# Patient Record
Sex: Female | Born: 1955 | Race: White | Hispanic: No | Smoking: Former smoker
Health system: Southern US, Community
[De-identification: ages and names within clinical notes are randomized; demographics above are authoritative.]

---

## 1998-03-13 ENCOUNTER — Other Ambulatory Visit: Admission: RE | Admit: 1998-03-13 | Discharge: 1998-03-13 | Payer: Self-pay | Admitting: Obstetrics and Gynecology

## 1999-03-21 ENCOUNTER — Other Ambulatory Visit: Admission: RE | Admit: 1999-03-21 | Discharge: 1999-03-21 | Payer: Self-pay | Admitting: Obstetrics and Gynecology

## 2000-04-16 ENCOUNTER — Other Ambulatory Visit: Admission: RE | Admit: 2000-04-16 | Discharge: 2000-04-16 | Payer: Self-pay | Admitting: Obstetrics and Gynecology

## 2001-07-29 ENCOUNTER — Other Ambulatory Visit: Admission: RE | Admit: 2001-07-29 | Discharge: 2001-07-29 | Payer: Self-pay | Admitting: Obstetrics and Gynecology

## 2002-11-08 ENCOUNTER — Other Ambulatory Visit: Admission: RE | Admit: 2002-11-08 | Discharge: 2002-11-08 | Payer: Self-pay | Admitting: Obstetrics and Gynecology

## 2003-12-26 ENCOUNTER — Other Ambulatory Visit: Admission: RE | Admit: 2003-12-26 | Discharge: 2003-12-26 | Payer: Self-pay | Admitting: Obstetrics and Gynecology

## 2005-01-21 ENCOUNTER — Other Ambulatory Visit: Admission: RE | Admit: 2005-01-21 | Discharge: 2005-01-21 | Payer: Self-pay | Admitting: Obstetrics and Gynecology

## 2009-01-31 ENCOUNTER — Encounter: Admission: RE | Admit: 2009-01-31 | Discharge: 2009-01-31 | Payer: Self-pay | Admitting: Family Medicine

## 2012-03-16 ENCOUNTER — Other Ambulatory Visit: Payer: Self-pay | Admitting: Family Medicine

## 2012-03-16 DIAGNOSIS — H539 Unspecified visual disturbance: Secondary | ICD-10-CM

## 2012-03-23 ENCOUNTER — Ambulatory Visit
Admission: RE | Admit: 2012-03-23 | Discharge: 2012-03-23 | Disposition: A | Discharge status: Home / Self Care | Payer: PRIVATE HEALTH INSURANCE | Source: Ambulatory Visit | Attending: Family Medicine | Admitting: Family Medicine

## 2012-03-23 DIAGNOSIS — H539 Unspecified visual disturbance: Secondary | ICD-10-CM

## 2012-09-21 ENCOUNTER — Other Ambulatory Visit: Payer: Self-pay | Admitting: Obstetrics and Gynecology

## 2012-09-21 DIAGNOSIS — R928 Other abnormal and inconclusive findings on diagnostic imaging of breast: Secondary | ICD-10-CM

## 2012-09-28 ENCOUNTER — Ambulatory Visit
Admission: RE | Admit: 2012-09-28 | Discharge: 2012-09-28 | Disposition: A | Discharge status: Home / Self Care | Payer: PRIVATE HEALTH INSURANCE | Source: Ambulatory Visit | Attending: Obstetrics and Gynecology | Admitting: Obstetrics and Gynecology

## 2012-09-28 DIAGNOSIS — R928 Other abnormal and inconclusive findings on diagnostic imaging of breast: Secondary | ICD-10-CM

## 2013-09-23 ENCOUNTER — Other Ambulatory Visit: Payer: Self-pay | Admitting: Obstetrics and Gynecology

## 2013-09-23 DIAGNOSIS — Z780 Asymptomatic menopausal state: Secondary | ICD-10-CM

## 2013-09-23 DIAGNOSIS — Z1231 Encounter for screening mammogram for malignant neoplasm of breast: Secondary | ICD-10-CM

## 2013-11-01 ENCOUNTER — Ambulatory Visit
Admission: RE | Admit: 2013-11-01 | Discharge: 2013-11-01 | Disposition: A | Discharge status: Home / Self Care | Payer: No Typology Code available for payment source | Source: Ambulatory Visit | Attending: Obstetrics and Gynecology | Admitting: Obstetrics and Gynecology

## 2013-11-01 DIAGNOSIS — Z1231 Encounter for screening mammogram for malignant neoplasm of breast: Secondary | ICD-10-CM

## 2013-11-01 DIAGNOSIS — Z780 Asymptomatic menopausal state: Secondary | ICD-10-CM

## 2014-05-08 IMAGING — MG MM DIGITAL SCREENING BILAT W/ CAD
9 of 12 series · 9 of 28 positions shown · non-contrast
Comparison: Previous exam(s).

CLINICAL DATA: Screening.

EXAM:
DIGITAL SCREENING BILATERAL MAMMOGRAM WITH CAD
DIGITAL BREAST TOMOSYNTHESIS
Digital breast tomosynthesis images are acquired in two projections.
These images are reviewed in combination with the digital mammogram,
confirming the findings below.

[L CC]
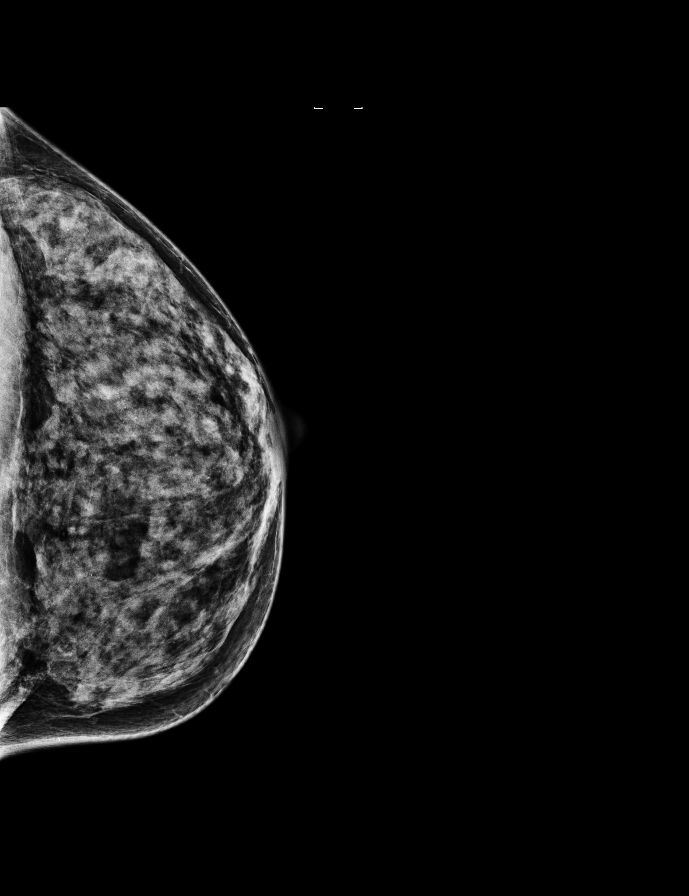

[R CC]
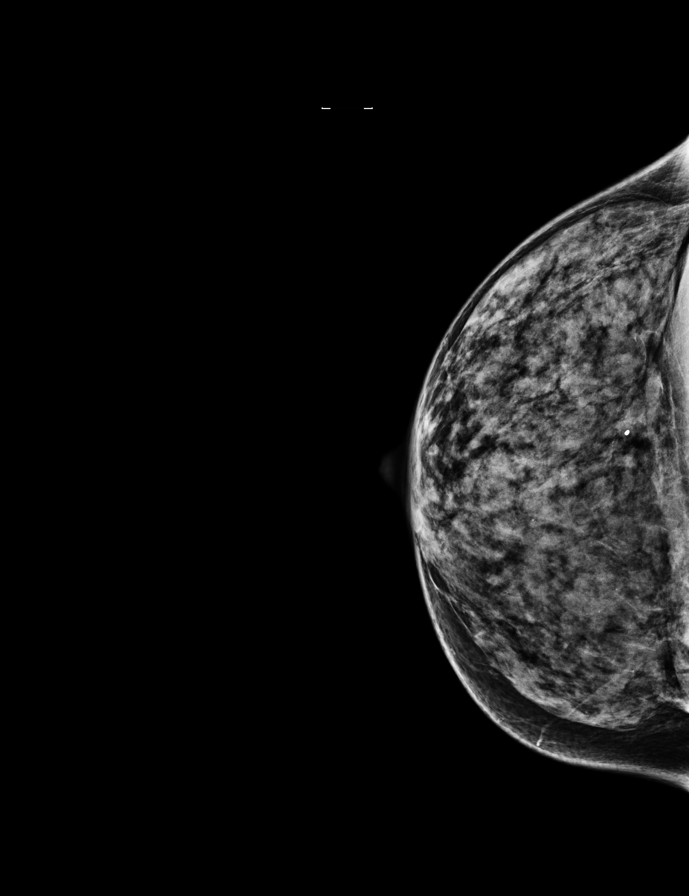

[R MLO]
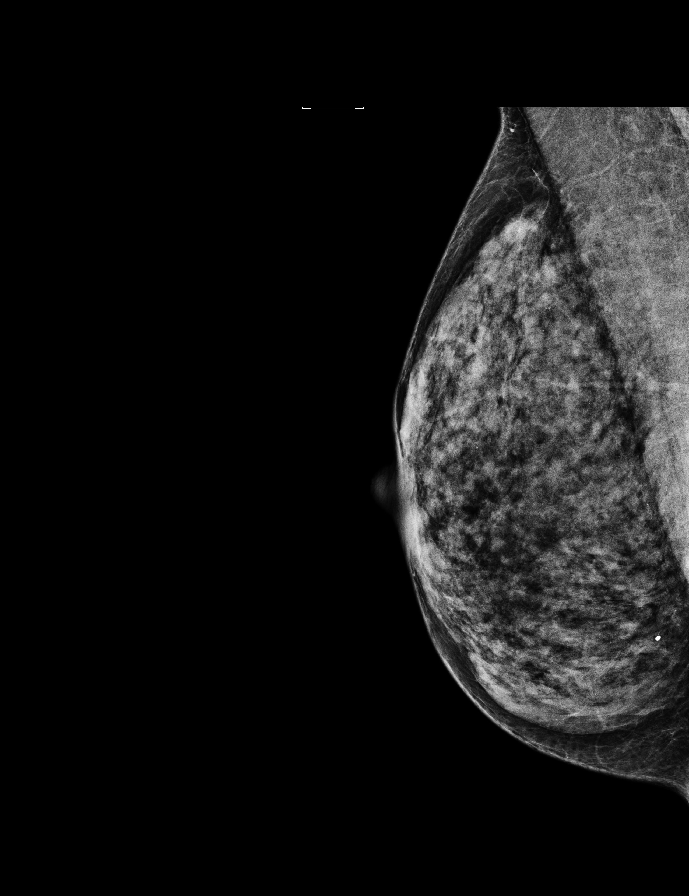

[L MLO]
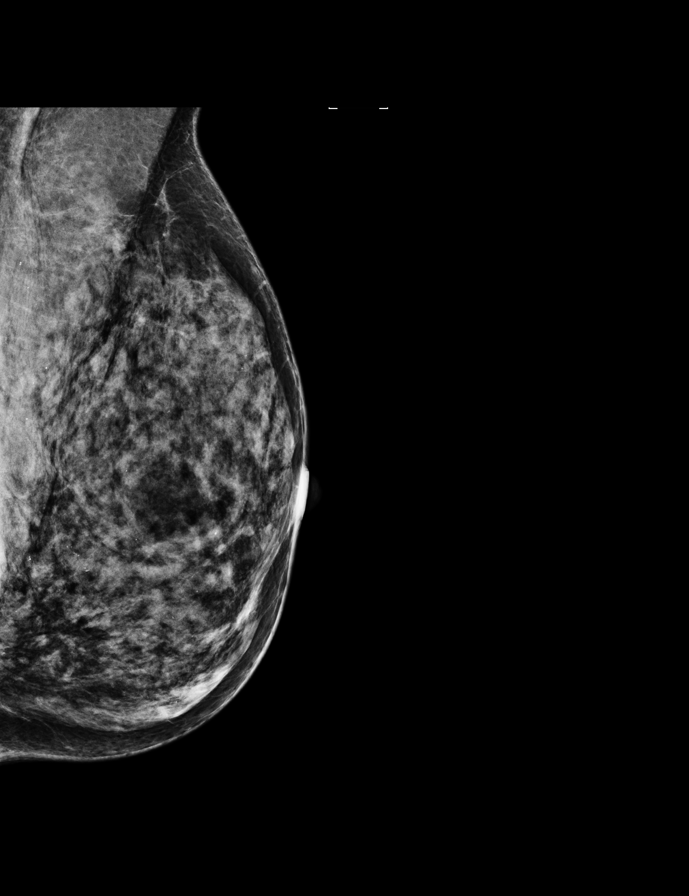

[R MLO tomo · tomo slice 22/43.0]
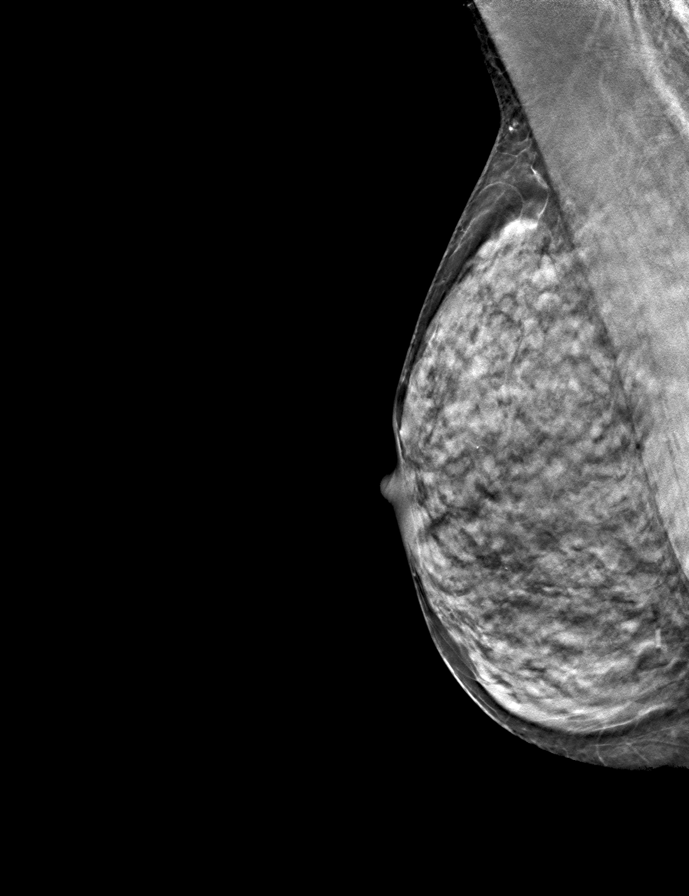

[L CC synth-2D]
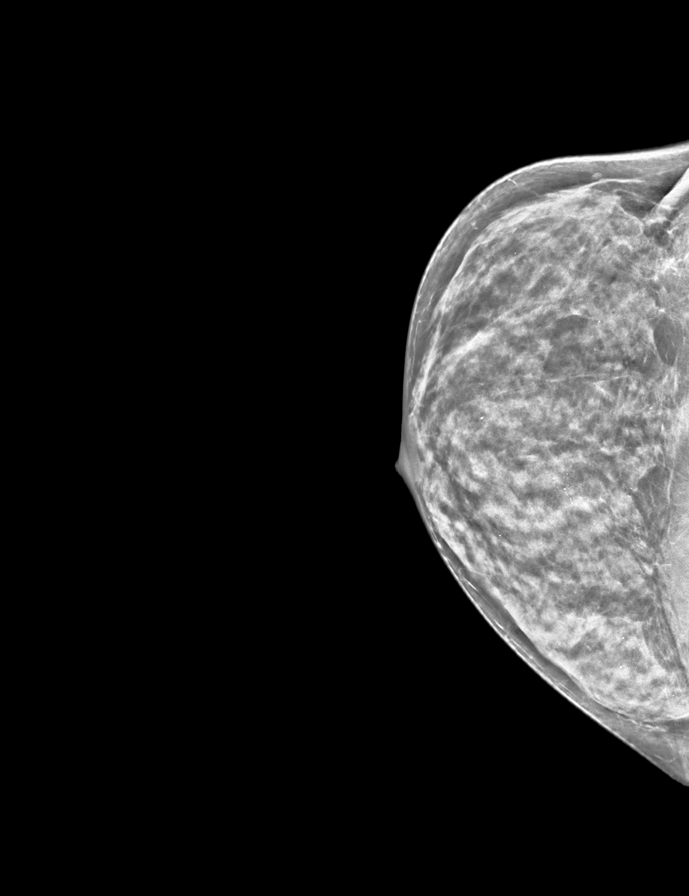

[R CC synth-2D]
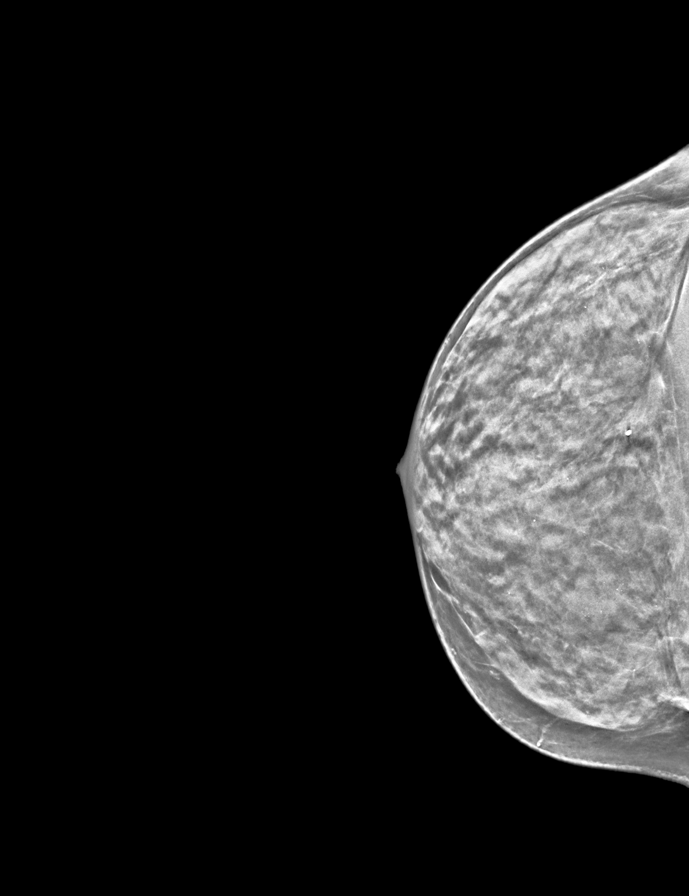

[L MLO synth-2D]
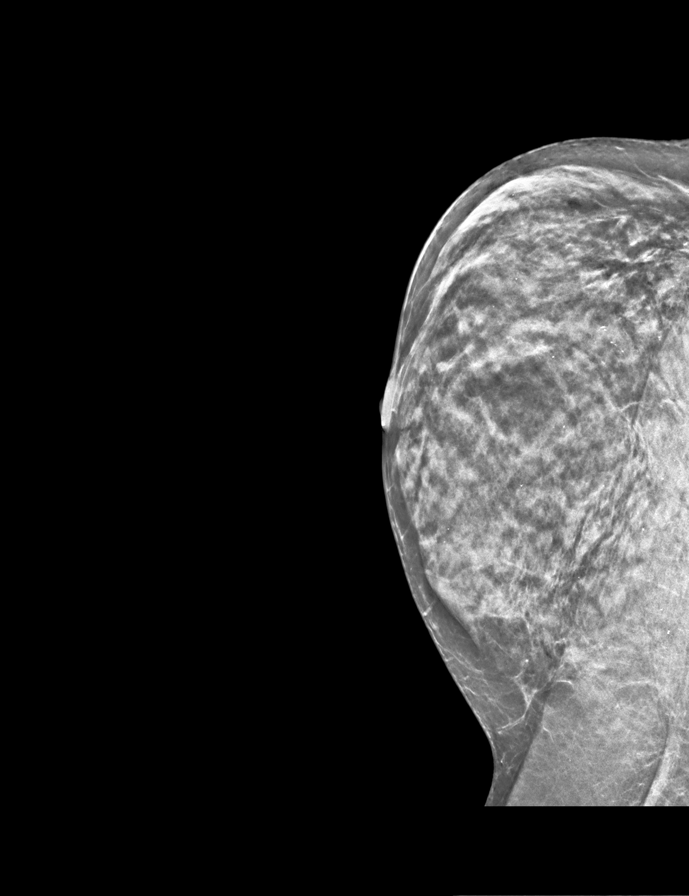

[R MLO synth-2D]
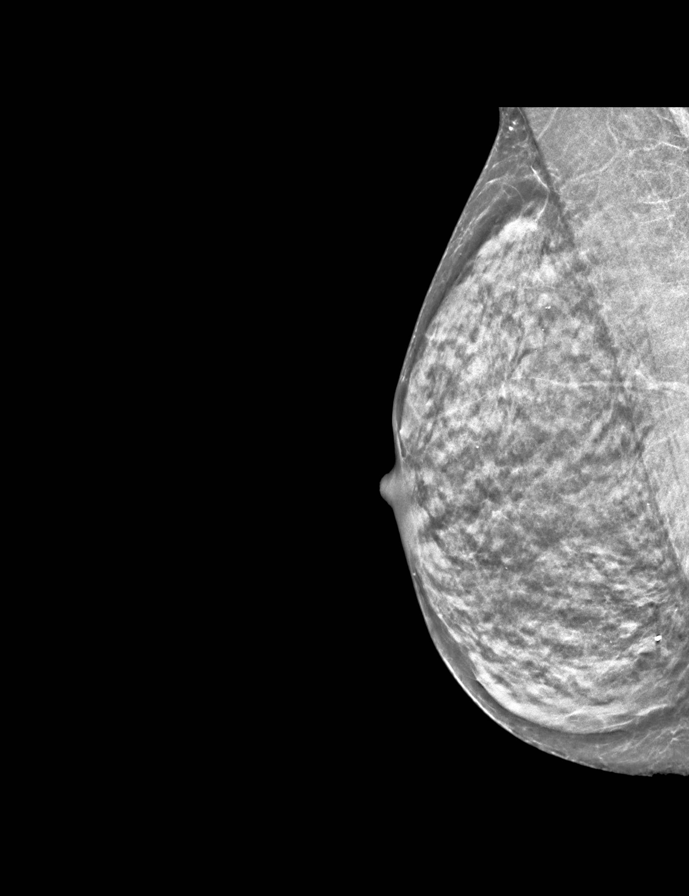

[9 of 28 positions shown; findings below may reference images not displayed]

ACR Breast Density Category d: The breasts are extremely dense,
which lowers the sensitivity of mammography.
FINDINGS: There are no findings suspicious for malignancy. Images were
processed with CAD.
IMPRESSION: No mammographic evidence of malignancy. A result letter of this
screening mammogram will be mailed directly to the patient.

RECOMMENDATION:
Screening mammogram in one year. (Code:[id number])

BI-RADS CATEGORY  1: Negative

## 2016-11-11 DIAGNOSIS — Z23 Encounter for immunization: Secondary | ICD-10-CM | POA: Diagnosis not present

## 2016-11-24 DIAGNOSIS — Z6821 Body mass index (BMI) 21.0-21.9, adult: Secondary | ICD-10-CM | POA: Diagnosis not present

## 2016-11-24 DIAGNOSIS — Z124 Encounter for screening for malignant neoplasm of cervix: Secondary | ICD-10-CM | POA: Diagnosis not present

## 2016-11-24 DIAGNOSIS — Z01419 Encounter for gynecological examination (general) (routine) without abnormal findings: Secondary | ICD-10-CM | POA: Diagnosis not present

## 2017-04-09 DIAGNOSIS — Z1211 Encounter for screening for malignant neoplasm of colon: Secondary | ICD-10-CM | POA: Diagnosis not present

## 2017-04-09 DIAGNOSIS — Z8371 Family history of colonic polyps: Secondary | ICD-10-CM | POA: Diagnosis not present

## 2017-04-09 DIAGNOSIS — Z800 Family history of malignant neoplasm of digestive organs: Secondary | ICD-10-CM | POA: Diagnosis not present

## 2017-04-15 DIAGNOSIS — H2513 Age-related nuclear cataract, bilateral: Secondary | ICD-10-CM | POA: Diagnosis not present

## 2017-04-15 DIAGNOSIS — H40033 Anatomical narrow angle, bilateral: Secondary | ICD-10-CM | POA: Diagnosis not present

## 2017-04-15 DIAGNOSIS — H40013 Open angle with borderline findings, low risk, bilateral: Secondary | ICD-10-CM | POA: Diagnosis not present

## 2017-10-14 DIAGNOSIS — H40033 Anatomical narrow angle, bilateral: Secondary | ICD-10-CM | POA: Diagnosis not present

## 2017-10-14 DIAGNOSIS — E70319 Ocular albinism, unspecified: Secondary | ICD-10-CM | POA: Diagnosis not present

## 2017-10-14 DIAGNOSIS — H40013 Open angle with borderline findings, low risk, bilateral: Secondary | ICD-10-CM | POA: Diagnosis not present

## 2017-12-07 DIAGNOSIS — Z124 Encounter for screening for malignant neoplasm of cervix: Secondary | ICD-10-CM | POA: Diagnosis not present

## 2017-12-07 DIAGNOSIS — Z01419 Encounter for gynecological examination (general) (routine) without abnormal findings: Secondary | ICD-10-CM | POA: Diagnosis not present

## 2017-12-07 DIAGNOSIS — Z1231 Encounter for screening mammogram for malignant neoplasm of breast: Secondary | ICD-10-CM | POA: Diagnosis not present

## 2018-04-19 DIAGNOSIS — R300 Dysuria: Secondary | ICD-10-CM | POA: Diagnosis not present

## 2018-04-22 DIAGNOSIS — H43813 Vitreous degeneration, bilateral: Secondary | ICD-10-CM | POA: Diagnosis not present

## 2018-04-22 DIAGNOSIS — H40013 Open angle with borderline findings, low risk, bilateral: Secondary | ICD-10-CM | POA: Diagnosis not present

## 2018-04-22 DIAGNOSIS — H2513 Age-related nuclear cataract, bilateral: Secondary | ICD-10-CM | POA: Diagnosis not present

## 2018-09-13 DIAGNOSIS — Z23 Encounter for immunization: Secondary | ICD-10-CM | POA: Diagnosis not present

## 2018-10-18 DIAGNOSIS — H40033 Anatomical narrow angle, bilateral: Secondary | ICD-10-CM | POA: Diagnosis not present

## 2018-10-18 DIAGNOSIS — E70319 Ocular albinism, unspecified: Secondary | ICD-10-CM | POA: Diagnosis not present

## 2018-10-18 DIAGNOSIS — H40013 Open angle with borderline findings, low risk, bilateral: Secondary | ICD-10-CM | POA: Diagnosis not present

## 2018-12-27 DIAGNOSIS — Z1231 Encounter for screening mammogram for malignant neoplasm of breast: Secondary | ICD-10-CM | POA: Diagnosis not present

## 2018-12-27 DIAGNOSIS — Z01419 Encounter for gynecological examination (general) (routine) without abnormal findings: Secondary | ICD-10-CM | POA: Diagnosis not present

## 2018-12-27 DIAGNOSIS — Z124 Encounter for screening for malignant neoplasm of cervix: Secondary | ICD-10-CM | POA: Diagnosis not present

## 2018-12-29 DIAGNOSIS — Z0000 Encounter for general adult medical examination without abnormal findings: Secondary | ICD-10-CM | POA: Diagnosis not present

## 2018-12-29 DIAGNOSIS — Z13220 Encounter for screening for lipoid disorders: Secondary | ICD-10-CM | POA: Diagnosis not present

## 2018-12-29 DIAGNOSIS — Z1159 Encounter for screening for other viral diseases: Secondary | ICD-10-CM | POA: Diagnosis not present

## 2019-01-18 DIAGNOSIS — R87619 Unspecified abnormal cytological findings in specimens from cervix uteri: Secondary | ICD-10-CM | POA: Diagnosis not present

## 2019-01-18 DIAGNOSIS — R87612 Low grade squamous intraepithelial lesion on cytologic smear of cervix (LGSIL): Secondary | ICD-10-CM | POA: Diagnosis not present

## 2019-01-19 DIAGNOSIS — L570 Actinic keratosis: Secondary | ICD-10-CM | POA: Diagnosis not present

## 2019-01-19 DIAGNOSIS — D049 Carcinoma in situ of skin, unspecified: Secondary | ICD-10-CM | POA: Diagnosis not present

## 2020-01-14 ENCOUNTER — Ambulatory Visit: Payer: No Typology Code available for payment source | Attending: Internal Medicine

## 2020-01-14 DIAGNOSIS — Z23 Encounter for immunization: Secondary | ICD-10-CM | POA: Insufficient documentation

## 2020-01-14 NOTE — Progress Notes (Signed)
   Covid-19 Vaccination Clinic  Name:  Monica Hines    MRN: [id number] DOB: 09/19/1956  01/14/2020  [name] was observed post Covid-19 immunization for 15 minutes without incident. She was provided with Vaccine Information Sheet and instruction to access the V-Safe system.   [name] was instructed to call 911 with any severe reactions post vaccine: Marland Kitchen Difficulty breathing  . Swelling of face and throat  . A fast heartbeat  . A bad rash all over body  . Dizziness and weakness   Immunizations Administered    Name Date Dose VIS Date Route   Pfizer COVID-19 Vaccine 01/14/2020  1:18 PM 0.3 mL 10/21/2019 Intramuscular   Manufacturer: [place]   Lot: [id number]   [place]: [id number]

## 2020-02-04 ENCOUNTER — Ambulatory Visit: Payer: No Typology Code available for payment source | Attending: Internal Medicine

## 2020-02-04 DIAGNOSIS — Z23 Encounter for immunization: Secondary | ICD-10-CM

## 2020-02-04 NOTE — Progress Notes (Signed)
   Covid-19 Vaccination Clinic  Name:  Analey Lathen    MRN: [id number] DOB: 07/06/1956  02/04/2020  Ms. Liddicoat was observed post Covid-19 immunization for 15 minutes without incident. She was provided with Vaccine Information Sheet and instruction to access the V-Safe system.   Ms. Schuring was instructed to call 911 with any severe reactions post vaccine: Marland Kitchen Difficulty breathing  . Swelling of face and throat  . A fast heartbeat  . A bad rash all over body  . Dizziness and weakness   Immunizations Administered    Name Date Dose VIS Date Route   Pfizer COVID-19 Vaccine 02/04/2020  8:22 AM 0.3 mL 10/21/2019 Intramuscular   Manufacturer: [place]   Lot: [id number]   [place]: [id number]

## 2021-12-25 DIAGNOSIS — Z96653 Presence of artificial knee joint, bilateral: Secondary | ICD-10-CM | POA: Diagnosis not present

## 2021-12-25 DIAGNOSIS — Z0000 Encounter for general adult medical examination without abnormal findings: Secondary | ICD-10-CM | POA: Diagnosis not present

## 2021-12-25 DIAGNOSIS — L578 Other skin changes due to chronic exposure to nonionizing radiation: Secondary | ICD-10-CM | POA: Diagnosis not present

## 2021-12-25 DIAGNOSIS — Z131 Encounter for screening for diabetes mellitus: Secondary | ICD-10-CM | POA: Diagnosis not present

## 2022-02-04 ENCOUNTER — Ambulatory Visit: Payer: Medicare Other | Admitting: Dermatology

## 2022-02-04 ENCOUNTER — Other Ambulatory Visit: Payer: Self-pay

## 2022-02-04 ENCOUNTER — Encounter: Payer: Self-pay | Admitting: Dermatology

## 2022-02-04 DIAGNOSIS — L720 Epidermal cyst: Secondary | ICD-10-CM

## 2022-02-04 DIAGNOSIS — L570 Actinic keratosis: Secondary | ICD-10-CM | POA: Diagnosis not present

## 2022-02-04 DIAGNOSIS — L738 Other specified follicular disorders: Secondary | ICD-10-CM | POA: Diagnosis not present

## 2022-02-04 DIAGNOSIS — D485 Neoplasm of uncertain behavior of skin: Secondary | ICD-10-CM

## 2022-02-04 DIAGNOSIS — Z1283 Encounter for screening for malignant neoplasm of skin: Secondary | ICD-10-CM

## 2022-02-04 NOTE — Patient Instructions (Signed)

## 2022-02-23 ENCOUNTER — Encounter: Payer: Self-pay | Admitting: Dermatology

## 2022-02-24 NOTE — Progress Notes (Signed)
? ?  New Patient ?  ?Subjective  ?Monica Hines is a 66 y.o. female who presents for the following: Annual Exam (Waist up exam per patient no personal history of atypia or skin cancer. New areas of concern upper back and arms lesions that are crusty to touch x year no bleeding.). ? ?General skin check, crust on face and arm ?Location:  ?Duration:  ?Quality:  ?Associated Signs/Symptoms: ?Modifying Factors:  ?Severity:  ?Timing: ?Context:  ? ? ?The following portions of the chart were reviewed this encounter and updated as appropriate:  Tobacco  Allergies  Meds  Problems  Med Hx  Surg Hx  Fam Hx   ?  ? ?Objective  ?Well appearing patient in no apparent distress; mood and affect are within normal limits. ?Per patient waist up exam: No atypical pigmented lesions.  1 possible nonmelanoma skin cancer left arm will be biopsied. ? ?Mid Forehead ?2 mm flesh-colored papule with eccentric dell, compatible dermoscopy ? ?Left Lower Eyelid ?Half millimeter epidermal white papules ? ?left upper arm ?6 mm focally eroded waxy pink crust, rule out superficial carcinoma ? ? ? ? ?Head - Anterior (Face) ?Ak on the face no history of pdt in the chart, history of Effudex with Dr Sherral Hammers = hives & picato pre covid no real reaction.  Multiple ? ? ? ?All skin waist up examined.  Areas beneath undergarments not fully examined ? ? ?Assessment & Plan  ?Screening exam for skin cancer ? ?Keep yearly skin exams, self examine twice annually.  Continued strict ultraviolet protection. ? ?Sebaceous hyperplasia of face ?Mid Forehead ? ?Told of similar appearance of early [place] so if there is growth or bleeding return for biopsy ? ?[place] ?Left Lower Eyelid ? ?Discussed elective I&D, no procedure performed ? ?Neoplasm of uncertain behavior of skin ?left upper arm ? ?Skin / nail biopsy ?Type of biopsy: tangential   ?Informed consent: discussed and consent obtained   ?Timeout: patient name, date of birth, surgical site, and procedure verified    ?Anesthesia: the lesion was anesthetized in a standard fashion   ?Anesthetic:  1% lidocaine w/ epinephrine 1-100,000 local infiltration ?Instrument used: flexible razor blade   ?Hemostasis achieved with: ferric subsulfate   ?Outcome: patient tolerated procedure well   ?Post-procedure details: sterile dressing applied and wound care instructions given   ?Dressing type: bandage and petrolatum   ? ?Specimen 1 - Surgical pathology ?Differential Diagnosis: bcc scc ? ?Check Margins: No ? ?AK (actinic keratosis) ?Head - Anterior (Face) ? ?PDT blue light in the fall November with alternate tac for + reaction with Dr Sherral Hammers = hives.   ? ? ?

## 2022-03-06 ENCOUNTER — Other Ambulatory Visit: Payer: Self-pay | Admitting: Obstetrics and Gynecology

## 2022-03-06 DIAGNOSIS — Z124 Encounter for screening for malignant neoplasm of cervix: Secondary | ICD-10-CM | POA: Diagnosis not present

## 2022-03-06 DIAGNOSIS — Z13820 Encounter for screening for osteoporosis: Secondary | ICD-10-CM

## 2022-03-06 DIAGNOSIS — Z1231 Encounter for screening mammogram for malignant neoplasm of breast: Secondary | ICD-10-CM | POA: Diagnosis not present

## 2022-03-06 DIAGNOSIS — Z01419 Encounter for gynecological examination (general) (routine) without abnormal findings: Secondary | ICD-10-CM | POA: Diagnosis not present

## 2022-04-10 ENCOUNTER — Ambulatory Visit
Admission: RE | Admit: 2022-04-10 | Discharge: 2022-04-10 | Disposition: A | Discharge status: Home / Self Care | Payer: Medicare Other | Source: Ambulatory Visit | Attending: Obstetrics and Gynecology | Admitting: Obstetrics and Gynecology

## 2022-04-10 DIAGNOSIS — Z13820 Encounter for screening for osteoporosis: Secondary | ICD-10-CM

## 2022-04-10 DIAGNOSIS — Z780 Asymptomatic menopausal state: Secondary | ICD-10-CM | POA: Diagnosis not present

## 2022-04-15 DIAGNOSIS — H25813 Combined forms of age-related cataract, bilateral: Secondary | ICD-10-CM | POA: Diagnosis not present

## 2022-04-15 DIAGNOSIS — H40013 Open angle with borderline findings, low risk, bilateral: Secondary | ICD-10-CM | POA: Diagnosis not present

## 2022-04-15 DIAGNOSIS — H40033 Anatomical narrow angle, bilateral: Secondary | ICD-10-CM | POA: Diagnosis not present

## 2022-04-15 DIAGNOSIS — H18593 Other hereditary corneal dystrophies, bilateral: Secondary | ICD-10-CM | POA: Diagnosis not present

## 2022-04-17 DIAGNOSIS — J029 Acute pharyngitis, unspecified: Secondary | ICD-10-CM | POA: Diagnosis not present

## 2022-04-17 DIAGNOSIS — Z03818 Encounter for observation for suspected exposure to other biological agents ruled out: Secondary | ICD-10-CM | POA: Diagnosis not present

## 2022-04-17 DIAGNOSIS — R059 Cough, unspecified: Secondary | ICD-10-CM | POA: Diagnosis not present

## 2022-08-13 DIAGNOSIS — L858 Other specified epidermal thickening: Secondary | ICD-10-CM | POA: Diagnosis not present

## 2022-09-11 DIAGNOSIS — D485 Neoplasm of uncertain behavior of skin: Secondary | ICD-10-CM | POA: Diagnosis not present

## 2022-09-11 DIAGNOSIS — C44622 Squamous cell carcinoma of skin of right upper limb, including shoulder: Secondary | ICD-10-CM | POA: Diagnosis not present

## 2022-10-20 ENCOUNTER — Ambulatory Visit: Payer: Medicare Other | Admitting: Dermatology

## 2022-10-30 DIAGNOSIS — U071 COVID-19: Secondary | ICD-10-CM | POA: Diagnosis not present

## 2022-11-12 DIAGNOSIS — C44622 Squamous cell carcinoma of skin of right upper limb, including shoulder: Secondary | ICD-10-CM | POA: Diagnosis not present

## 2022-12-26 DIAGNOSIS — Z136 Encounter for screening for cardiovascular disorders: Secondary | ICD-10-CM | POA: Diagnosis not present

## 2022-12-26 DIAGNOSIS — C44622 Squamous cell carcinoma of skin of right upper limb, including shoulder: Secondary | ICD-10-CM | POA: Diagnosis not present

## 2022-12-26 DIAGNOSIS — Z13220 Encounter for screening for lipoid disorders: Secondary | ICD-10-CM | POA: Diagnosis not present

## 2022-12-26 DIAGNOSIS — R011 Cardiac murmur, unspecified: Secondary | ICD-10-CM | POA: Diagnosis not present

## 2022-12-26 DIAGNOSIS — Z0000 Encounter for general adult medical examination without abnormal findings: Secondary | ICD-10-CM | POA: Diagnosis not present

## 2023-01-19 DIAGNOSIS — K080 Exfoliation of teeth due to systemic causes: Secondary | ICD-10-CM | POA: Diagnosis not present

## 2023-02-09 ENCOUNTER — Ambulatory Visit: Payer: Medicare Other | Admitting: Dermatology

## 2023-03-10 DIAGNOSIS — Z1231 Encounter for screening mammogram for malignant neoplasm of breast: Secondary | ICD-10-CM | POA: Diagnosis not present

## 2023-04-01 DIAGNOSIS — L821 Other seborrheic keratosis: Secondary | ICD-10-CM | POA: Diagnosis not present

## 2023-04-01 DIAGNOSIS — L570 Actinic keratosis: Secondary | ICD-10-CM | POA: Diagnosis not present

## 2023-04-01 DIAGNOSIS — L578 Other skin changes due to chronic exposure to nonionizing radiation: Secondary | ICD-10-CM | POA: Diagnosis not present

## 2023-04-01 DIAGNOSIS — D1801 Hemangioma of skin and subcutaneous tissue: Secondary | ICD-10-CM | POA: Diagnosis not present

## 2023-04-01 DIAGNOSIS — L814 Other melanin hyperpigmentation: Secondary | ICD-10-CM | POA: Diagnosis not present

## 2023-06-05 DIAGNOSIS — H25813 Combined forms of age-related cataract, bilateral: Secondary | ICD-10-CM | POA: Diagnosis not present

## 2023-06-05 DIAGNOSIS — H40013 Open angle with borderline findings, low risk, bilateral: Secondary | ICD-10-CM | POA: Diagnosis not present

## 2023-06-05 DIAGNOSIS — H18593 Other hereditary corneal dystrophies, bilateral: Secondary | ICD-10-CM | POA: Diagnosis not present

## 2023-06-05 DIAGNOSIS — H40033 Anatomical narrow angle, bilateral: Secondary | ICD-10-CM | POA: Diagnosis not present

## 2023-06-05 DIAGNOSIS — H524 Presbyopia: Secondary | ICD-10-CM | POA: Diagnosis not present

## 2023-06-11 DIAGNOSIS — H524 Presbyopia: Secondary | ICD-10-CM | POA: Diagnosis not present

## 2023-08-26 DIAGNOSIS — K080 Exfoliation of teeth due to systemic causes: Secondary | ICD-10-CM | POA: Diagnosis not present

## 2023-09-10 DIAGNOSIS — M25562 Pain in left knee: Secondary | ICD-10-CM | POA: Diagnosis not present

## 2023-10-05 DIAGNOSIS — L570 Actinic keratosis: Secondary | ICD-10-CM | POA: Diagnosis not present

## 2023-10-05 DIAGNOSIS — D225 Melanocytic nevi of trunk: Secondary | ICD-10-CM | POA: Diagnosis not present

## 2023-10-05 DIAGNOSIS — L814 Other melanin hyperpigmentation: Secondary | ICD-10-CM | POA: Diagnosis not present

## 2023-10-05 DIAGNOSIS — C4442 Squamous cell carcinoma of skin of scalp and neck: Secondary | ICD-10-CM | POA: Diagnosis not present

## 2023-10-05 DIAGNOSIS — L821 Other seborrheic keratosis: Secondary | ICD-10-CM | POA: Diagnosis not present

## 2023-10-05 DIAGNOSIS — Z08 Encounter for follow-up examination after completed treatment for malignant neoplasm: Secondary | ICD-10-CM | POA: Diagnosis not present

## 2023-10-05 DIAGNOSIS — D485 Neoplasm of uncertain behavior of skin: Secondary | ICD-10-CM | POA: Diagnosis not present

## 2023-10-29 DIAGNOSIS — C4442 Squamous cell carcinoma of skin of scalp and neck: Secondary | ICD-10-CM | POA: Diagnosis not present

## 2023-11-26 DIAGNOSIS — L244 Irritant contact dermatitis due to drugs in contact with skin: Secondary | ICD-10-CM | POA: Diagnosis not present

## 2024-01-01 DIAGNOSIS — E782 Mixed hyperlipidemia: Secondary | ICD-10-CM | POA: Diagnosis not present

## 2024-01-01 DIAGNOSIS — Z0000 Encounter for general adult medical examination without abnormal findings: Secondary | ICD-10-CM | POA: Diagnosis not present

## 2024-01-01 DIAGNOSIS — Z130 Encounter for screening for diseases of the blood and blood-forming organs and certain disorders involving the immune mechanism: Secondary | ICD-10-CM | POA: Diagnosis not present

## 2024-03-17 DIAGNOSIS — N8110 Cystocele, unspecified: Secondary | ICD-10-CM | POA: Diagnosis not present

## 2024-03-17 DIAGNOSIS — Z124 Encounter for screening for malignant neoplasm of cervix: Secondary | ICD-10-CM | POA: Diagnosis not present

## 2024-03-17 DIAGNOSIS — Z01419 Encounter for gynecological examination (general) (routine) without abnormal findings: Secondary | ICD-10-CM | POA: Diagnosis not present

## 2024-03-17 DIAGNOSIS — Z1231 Encounter for screening mammogram for malignant neoplasm of breast: Secondary | ICD-10-CM | POA: Diagnosis not present

## 2024-03-17 DIAGNOSIS — A6009 Herpesviral infection of other urogenital tract: Secondary | ICD-10-CM | POA: Diagnosis not present

## 2024-04-06 DIAGNOSIS — K080 Exfoliation of teeth due to systemic causes: Secondary | ICD-10-CM | POA: Diagnosis not present

## 2024-04-11 DIAGNOSIS — L728 Other follicular cysts of the skin and subcutaneous tissue: Secondary | ICD-10-CM | POA: Diagnosis not present

## 2024-04-11 DIAGNOSIS — L820 Inflamed seborrheic keratosis: Secondary | ICD-10-CM | POA: Diagnosis not present

## 2024-04-11 DIAGNOSIS — L2989 Other pruritus: Secondary | ICD-10-CM | POA: Diagnosis not present

## 2024-04-11 DIAGNOSIS — L814 Other melanin hyperpigmentation: Secondary | ICD-10-CM | POA: Diagnosis not present

## 2024-04-11 DIAGNOSIS — L570 Actinic keratosis: Secondary | ICD-10-CM | POA: Diagnosis not present

## 2024-04-11 DIAGNOSIS — D225 Melanocytic nevi of trunk: Secondary | ICD-10-CM | POA: Diagnosis not present

## 2024-04-11 DIAGNOSIS — Z789 Other specified health status: Secondary | ICD-10-CM | POA: Diagnosis not present

## 2024-04-11 DIAGNOSIS — R208 Other disturbances of skin sensation: Secondary | ICD-10-CM | POA: Diagnosis not present

## 2024-04-11 DIAGNOSIS — L821 Other seborrheic keratosis: Secondary | ICD-10-CM | POA: Diagnosis not present

## 2024-06-06 DIAGNOSIS — H40013 Open angle with borderline findings, low risk, bilateral: Secondary | ICD-10-CM | POA: Diagnosis not present

## 2024-06-06 DIAGNOSIS — H25813 Combined forms of age-related cataract, bilateral: Secondary | ICD-10-CM | POA: Diagnosis not present

## 2024-06-13 DIAGNOSIS — Z6823 Body mass index (BMI) 23.0-23.9, adult: Secondary | ICD-10-CM | POA: Diagnosis not present

## 2024-06-13 DIAGNOSIS — U071 COVID-19: Secondary | ICD-10-CM | POA: Diagnosis not present

## 2024-08-30 DIAGNOSIS — L728 Other follicular cysts of the skin and subcutaneous tissue: Secondary | ICD-10-CM | POA: Diagnosis not present

## 2024-08-30 DIAGNOSIS — Z789 Other specified health status: Secondary | ICD-10-CM | POA: Diagnosis not present

## 2024-10-03 DIAGNOSIS — L728 Other follicular cysts of the skin and subcutaneous tissue: Secondary | ICD-10-CM | POA: Diagnosis not present

## 2024-10-11 DIAGNOSIS — L570 Actinic keratosis: Secondary | ICD-10-CM | POA: Diagnosis not present

## 2024-10-11 DIAGNOSIS — D1801 Hemangioma of skin and subcutaneous tissue: Secondary | ICD-10-CM | POA: Diagnosis not present

## 2024-10-11 DIAGNOSIS — L814 Other melanin hyperpigmentation: Secondary | ICD-10-CM | POA: Diagnosis not present

## 2024-10-11 DIAGNOSIS — Z08 Encounter for follow-up examination after completed treatment for malignant neoplasm: Secondary | ICD-10-CM | POA: Diagnosis not present

## 2024-10-11 DIAGNOSIS — L821 Other seborrheic keratosis: Secondary | ICD-10-CM | POA: Diagnosis not present
